# Patient Record
Sex: Male | Born: 1985 | Race: White | Hispanic: No | Marital: Single
Health system: Southern US, Community
[De-identification: ages and names within clinical notes are randomized; demographics above are authoritative.]

---

## 2007-10-02 ENCOUNTER — Emergency Department (HOSPITAL_COMMUNITY): Admission: EM | Admit: 2007-10-02 | Discharge: 2007-10-02 | Payer: Self-pay | Admitting: Emergency Medicine

## 2010-01-06 IMAGING — CR DG FINGER RING 2+V*L*
4 series · 4 of 4 positions shown · non-contrast
Comparison: None available

CLINICAL DATA: Smashed in door

LEFT RING FINGER 2+V

[x finger pa left]
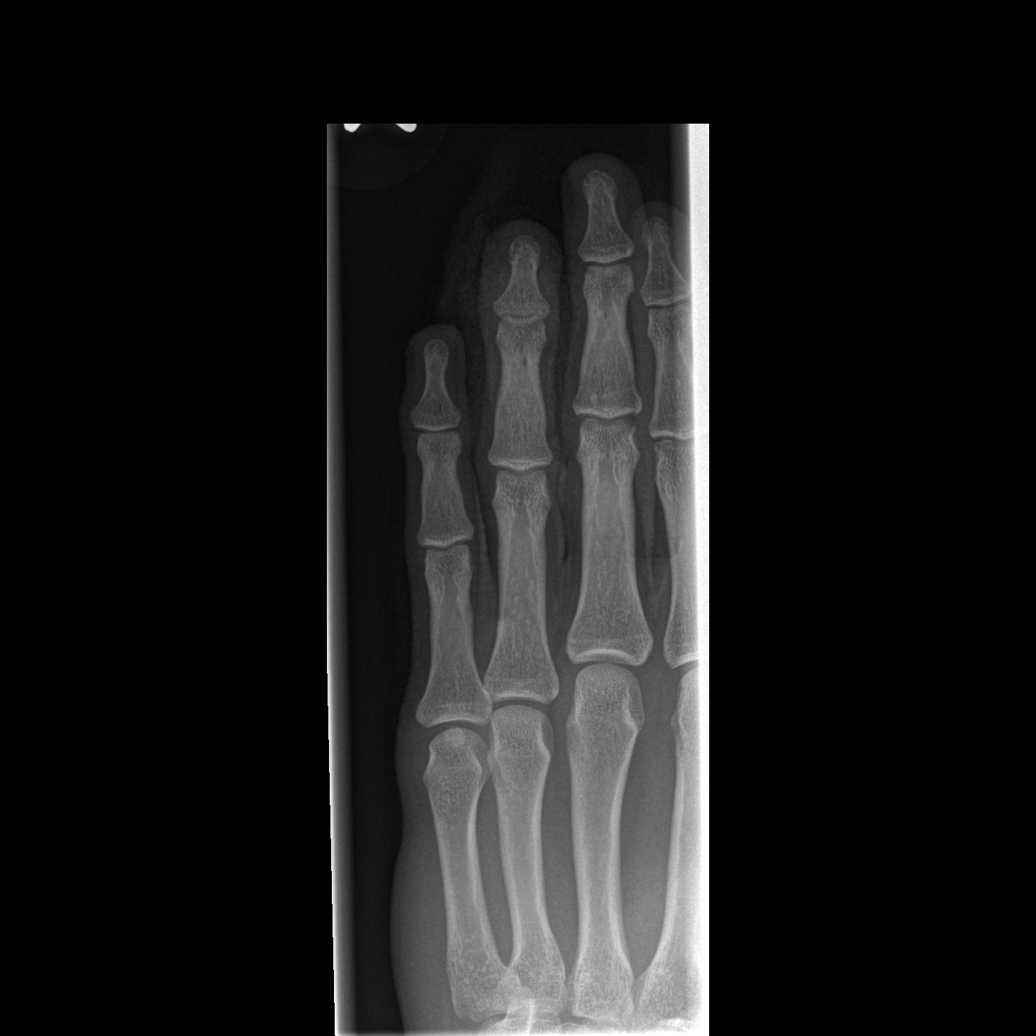

[x finger obl. left]
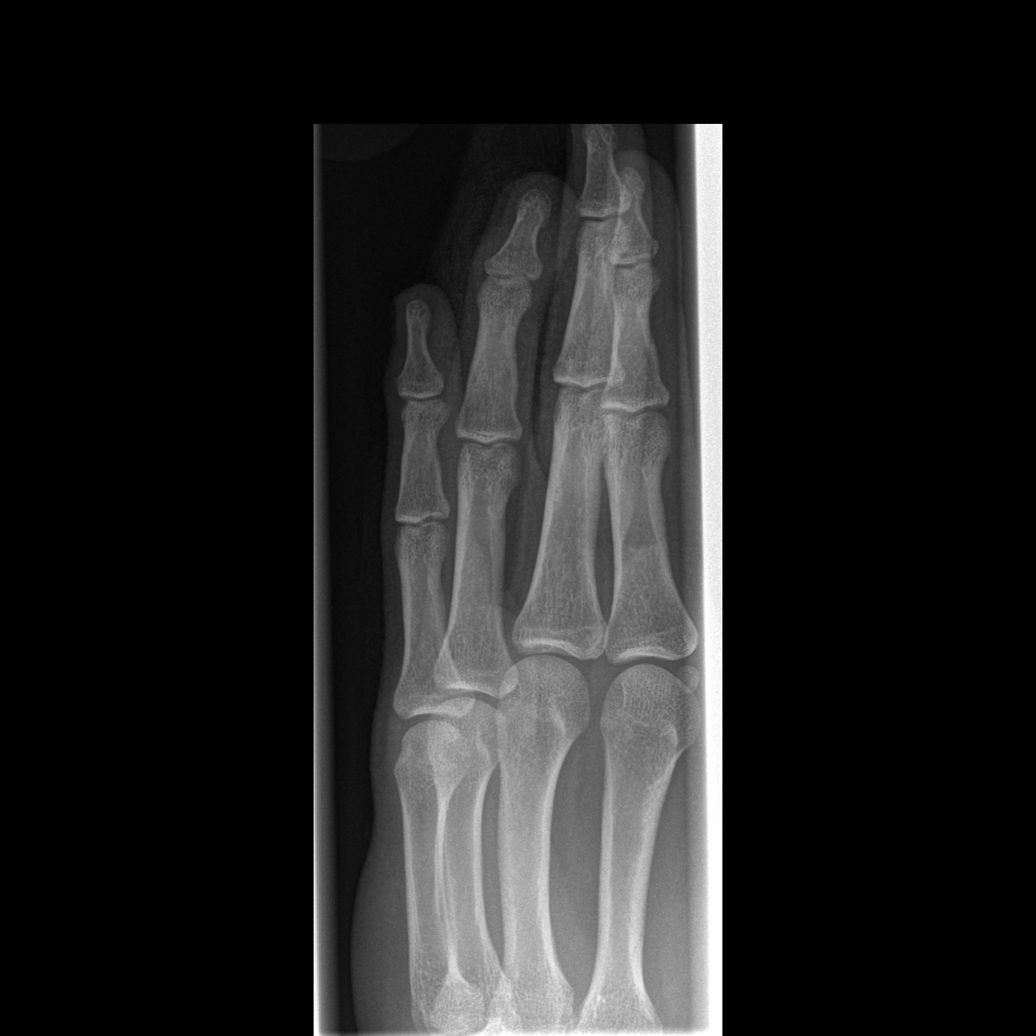

[x finger lateral left (1 of 2)]
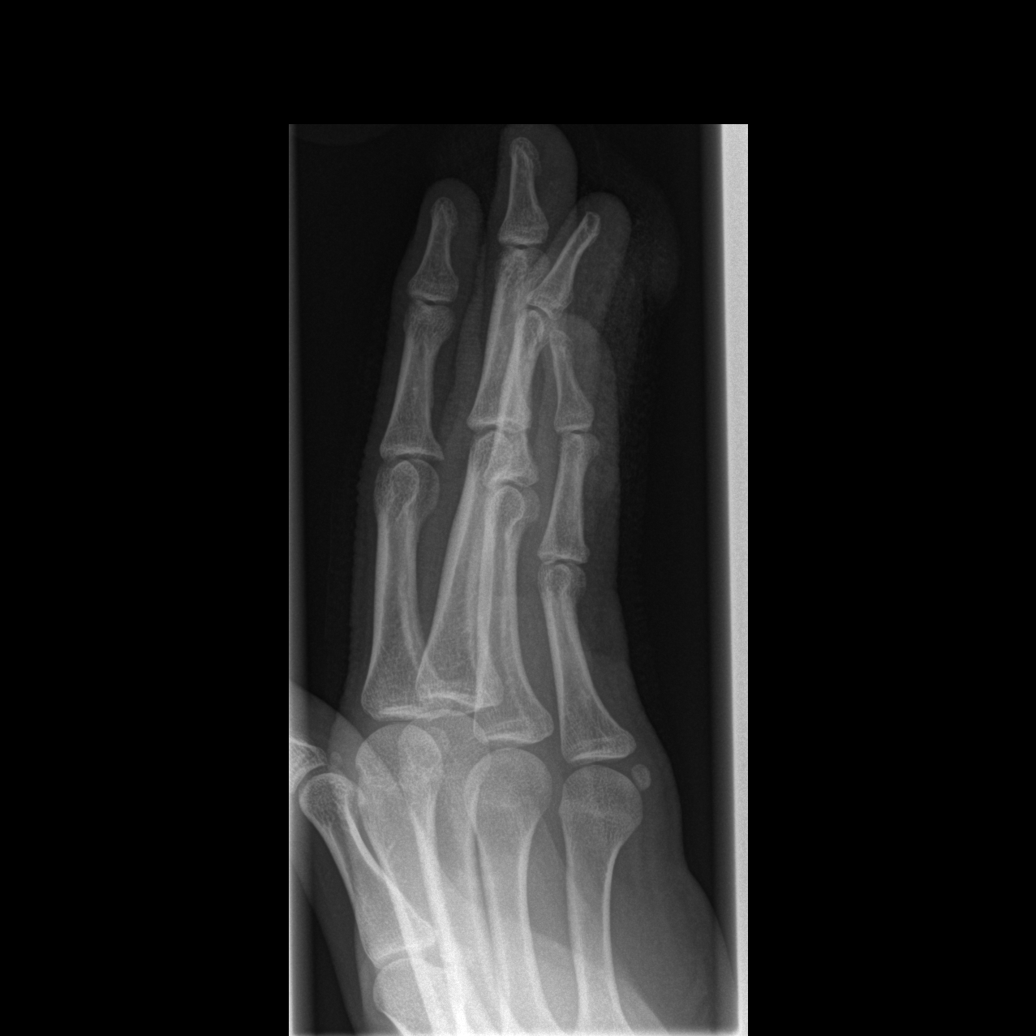

[x finger lateral left (2 of 2)]
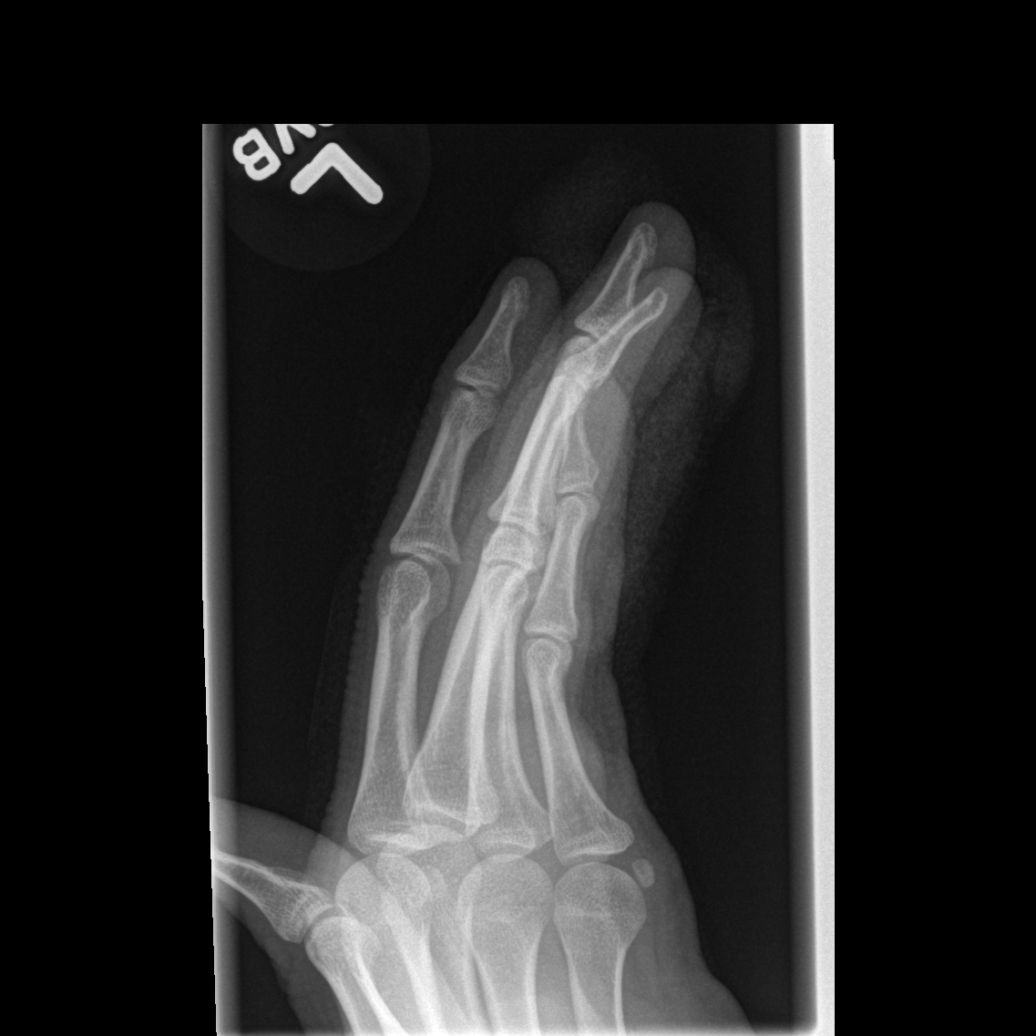

[4 of 4 positions shown; findings below may reference images not displayed]

FINDINGS: Mild soft tissue swelling.  No fractures seen.  No
radiopaque foreign body.
IMPRESSION: No fracture

## 2019-09-05 ENCOUNTER — Ambulatory Visit: Payer: Self-pay | Attending: Internal Medicine

## 2019-09-05 DIAGNOSIS — Z23 Encounter for immunization: Secondary | ICD-10-CM

## 2019-09-05 NOTE — Progress Notes (Signed)
   [facility] Vaccination [facility]  Name:  Leonard Buckley    MRN: [id number] DOB: [birth year]  09/05/2019  Mr. Leonard Buckley was observed post Covid-19 immunization for 15 minutes without incident. He was provided with Vaccine Information Sheet and instruction to access the V-Safe system.   Mr. Leonard Buckley was instructed to call 911 with any severe reactions post vaccine: Marland Kitchen Difficulty breathing  . Swelling of face and throat  . A fast heartbeat  . A bad rash all over body  . Dizziness and weakness   Immunizations Administered    Name Date Dose VIS Date Route   Pfizer COVID-19 Vaccine 09/05/2019  4:20 PM 0.3 mL 06/08/2018 Intramuscular   Manufacturer: [employer], [employer]   Lot: [id number]   NDC: [address]-6

## 2019-09-26 ENCOUNTER — Ambulatory Visit: Payer: Self-pay | Attending: Internal Medicine

## 2019-09-26 DIAGNOSIS — Z23 Encounter for immunization: Secondary | ICD-10-CM

## 2019-09-26 NOTE — Progress Notes (Signed)
   Covid-19 Vaccination Clinic  Name:  Leonard Buckley    MRN: [id number] DOB: 1986/02/16  09/26/2019  [name] was observed post Covid-19 immunization for 15 minutes without incident. He was provided with Vaccine Information Sheet and instruction to access the V-Safe system.   [name] was instructed to call 911 with any severe reactions post vaccine: Marland Kitchen Difficulty breathing  . Swelling of face and throat  . A fast heartbeat  . A bad rash all over body  . Dizziness and weakness   Immunizations Administered    Name Date Dose VIS Date Route   Pfizer COVID-19 Vaccine 09/26/2019  3:14 PM 0.3 mL 06/08/2018 Intramuscular   Manufacturer: [employer], [employer]   Lot: [id number]   NDC: [address]-1
